# Patient Record
Sex: Male | Born: 1970 | Race: Black or African American | Hispanic: No | Marital: Single | Smoking: Never smoker
Health system: Southern US, Community
[De-identification: ages and names within clinical notes are randomized; demographics above are authoritative.]

---

## 1999-01-02 ENCOUNTER — Emergency Department (HOSPITAL_COMMUNITY): Admission: EM | Admit: 1999-01-02 | Discharge: 1999-01-02 | Payer: Self-pay | Admitting: Internal Medicine

## 2000-01-30 ENCOUNTER — Encounter: Admission: RE | Admit: 2000-01-30 | Discharge: 2000-01-30 | Payer: Self-pay | Admitting: Family Medicine

## 2001-12-02 ENCOUNTER — Encounter: Admission: RE | Admit: 2001-12-02 | Discharge: 2001-12-02 | Payer: Self-pay | Admitting: Family Medicine

## 2002-02-09 ENCOUNTER — Emergency Department (HOSPITAL_COMMUNITY): Admission: EM | Admit: 2002-02-09 | Discharge: 2002-02-09 | Payer: Self-pay | Admitting: Emergency Medicine

## 2003-02-14 ENCOUNTER — Emergency Department (HOSPITAL_COMMUNITY): Admission: EM | Admit: 2003-02-14 | Discharge: 2003-02-14 | Payer: Self-pay | Admitting: Emergency Medicine

## 2003-03-16 ENCOUNTER — Emergency Department (HOSPITAL_COMMUNITY): Admission: EM | Admit: 2003-03-16 | Discharge: 2003-03-16 | Payer: Self-pay | Admitting: Emergency Medicine

## 2004-02-20 ENCOUNTER — Emergency Department (HOSPITAL_COMMUNITY): Admission: EM | Admit: 2004-02-20 | Discharge: 2004-02-20 | Payer: Self-pay | Admitting: Family Medicine

## 2012-04-06 ENCOUNTER — Encounter (HOSPITAL_COMMUNITY): Payer: Self-pay | Admitting: Emergency Medicine

## 2012-04-06 ENCOUNTER — Emergency Department (HOSPITAL_COMMUNITY)
Admission: EM | Admit: 2012-04-06 | Discharge: 2012-04-06 | Disposition: A | Discharge status: Home / Self Care | Payer: 59 | Attending: Emergency Medicine | Admitting: Emergency Medicine

## 2012-04-06 DIAGNOSIS — Z23 Encounter for immunization: Secondary | ICD-10-CM | POA: Insufficient documentation

## 2012-04-06 DIAGNOSIS — S61209A Unspecified open wound of unspecified finger without damage to nail, initial encounter: Secondary | ICD-10-CM | POA: Insufficient documentation

## 2012-04-06 DIAGNOSIS — S61218A Laceration without foreign body of other finger without damage to nail, initial encounter: Secondary | ICD-10-CM

## 2012-04-06 DIAGNOSIS — W298XXA Contact with other powered powered hand tools and household machinery, initial encounter: Secondary | ICD-10-CM | POA: Insufficient documentation

## 2012-04-06 MED ORDER — TETANUS-DIPHTH-ACELL PERTUSSIS 5-2.5-18.5 LF-MCG/0.5 IM SUSP
0.5000 mL | Freq: Once | INTRAMUSCULAR | Status: AC
  Administered 2012-04-06: 0.5 mL via INTRAMUSCULAR
  Filled 2012-04-06: qty 0.5

## 2012-04-06 NOTE — ED Provider Notes (Signed)
History     CSN: [id number]  Arrival date & time 04/06/12  0904   First MD Initiated Contact with Patient 04/06/12 1008      Chief Complaint  Patient presents with  . Extremity Laceration    (Consider location/radiation/quality/duration/timing/severity/associated sxs/prior treatment) HPI The patient presents to the ER with a laceration to the L index finger. The patient was using a hedge trimmer yesterday around 2 pm when this occurred. The patient states that he cleansed the area well and wrapped it up. The patient denies weakness or numbness in the finger. The patient states that he did not take anything by mouth for any discomfort.  History reviewed. No pertinent past medical history.  History reviewed. No pertinent past surgical history.  No family history on file.  History  Substance Use Topics  . Smoking status: Never Smoker   . Smokeless tobacco: Never Used  . Alcohol Use: 14.4 oz/week    24 Cans of beer per week      Review of Systems All other systems negative except as documented in the HPI. All pertinent positives and negatives as reviewed in the HPI.  Allergies  Review of patient's allergies indicates no known allergies.  Home Medications  No current outpatient prescriptions on file.  BP 121/78  Pulse 64  Temp 98 F (36.7 C) (Oral)  Resp 16  SpO2 100%  Physical Exam  Constitutional: He appears well-developed and well-nourished. No distress.  Musculoskeletal:       Left hand: He exhibits normal range of motion. normal sensation noted. Normal strength noted. He exhibits no finger abduction.       Hands:   ED Course  Procedures (including critical care time)   LACERATION REPAIR Performed by: Carlyle Dolly Authorized by: Carlyle Dolly Consent: Verbal consent obtained. Risks and benefits: risks, benefits and alternatives were discussed Consent given by: patient Patient identity confirmed: provided demographic data Prepped and  Draped in normal sterile fashion Wound explored  Laceration Location: L lateral Distal 2nd  finger   Laceration Length: 2cm  No Foreign Bodies seen or palpated  Anesthesia: None  Local anesthetic: NA  Anesthetic total: 0  Irrigation method: syringe Amount of cleaning: standard  Skin closure: Steri strips  Number of sutures: see above  Technique: see above  Patient tolerance: Patient tolerated the procedure well with no immediate complications.  The laceration was delayed in the initial presentation and being that this is at the distal end of a digit I advised the patient of the increased risk of infection. We closed with steri strips and placed a bandage.   MDM         Carlyle Dolly, PA-C 04/06/12 1133

## 2012-04-06 NOTE — ED Notes (Signed)
Sustained laceration to left index finger yesterday afternoon while trimming the hedges. Would not stop bleeding so here to have it checked today.

## 2012-04-06 NOTE — ED Provider Notes (Signed)
Medical screening examination/treatment/procedure(s) were performed by non-physician practitioner and as supervising physician I was immediately available for consultation/collaboration.  Monice Lundy, MD 04/06/12 1522 

## 2014-02-18 ENCOUNTER — Encounter (HOSPITAL_COMMUNITY): Payer: Self-pay | Admitting: Emergency Medicine

## 2014-02-18 ENCOUNTER — Emergency Department (HOSPITAL_COMMUNITY): Payer: 59

## 2014-02-18 ENCOUNTER — Emergency Department (HOSPITAL_COMMUNITY)
Admission: EM | Admit: 2014-02-18 | Discharge: 2014-02-18 | Disposition: A | Discharge status: Home / Self Care | Payer: 59 | Attending: Emergency Medicine | Admitting: Emergency Medicine

## 2014-02-18 DIAGNOSIS — S39012A Strain of muscle, fascia and tendon of lower back, initial encounter: Secondary | ICD-10-CM

## 2014-02-18 DIAGNOSIS — IMO0002 Reserved for concepts with insufficient information to code with codable children: Secondary | ICD-10-CM | POA: Insufficient documentation

## 2014-02-18 DIAGNOSIS — I10 Essential (primary) hypertension: Secondary | ICD-10-CM | POA: Insufficient documentation

## 2014-02-18 DIAGNOSIS — S43402A Unspecified sprain of left shoulder joint, initial encounter: Secondary | ICD-10-CM

## 2014-02-18 DIAGNOSIS — Y92410 Unspecified street and highway as the place of occurrence of the external cause: Secondary | ICD-10-CM | POA: Insufficient documentation

## 2014-02-18 DIAGNOSIS — S335XXA Sprain of ligaments of lumbar spine, initial encounter: Secondary | ICD-10-CM | POA: Insufficient documentation

## 2014-02-18 DIAGNOSIS — Y9389 Activity, other specified: Secondary | ICD-10-CM | POA: Insufficient documentation

## 2014-02-18 MED ORDER — IBUPROFEN 800 MG PO TABS: 800.0000 mg | ORAL_TABLET | Freq: Three times a day (TID) | ORAL | Status: DC | PRN

## 2014-02-18 MED ORDER — HYDROCODONE-ACETAMINOPHEN 5-325 MG PO TABS: 1.0000 | ORAL_TABLET | Freq: Four times a day (QID) | ORAL | Status: DC | PRN

## 2014-02-18 NOTE — ED Notes (Signed)
Patient transported to X-ray 
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
## 2014-02-18 NOTE — Discharge Instructions (Signed)
Return here as needed.  Followup with primary care Dr. or an urgent care. your x-rays were negative.  Use ice and heat on your lower back and shoulder

## 2014-02-19 NOTE — ED Provider Notes (Signed)
Medical screening examination/treatment/procedure(s) were performed by non-physician practitioner and as supervising physician I was immediately available for consultation/collaboration.   EKG Interpretation None        William Narmeen Kerper, MD 02/19/14 0004 

## 2014-11-05 ENCOUNTER — Emergency Department (HOSPITAL_COMMUNITY): Payer: Self-pay

## 2014-11-05 ENCOUNTER — Encounter (HOSPITAL_COMMUNITY): Payer: Self-pay | Admitting: Emergency Medicine

## 2014-11-05 ENCOUNTER — Emergency Department (HOSPITAL_COMMUNITY)
Admission: EM | Admit: 2014-11-05 | Discharge: 2014-11-05 | Disposition: A | Discharge status: Home / Self Care | Payer: Self-pay | Attending: Emergency Medicine | Admitting: Emergency Medicine

## 2014-11-05 DIAGNOSIS — S92405B Nondisplaced unspecified fracture of left great toe, initial encounter for open fracture: Secondary | ICD-10-CM | POA: Insufficient documentation

## 2014-11-05 DIAGNOSIS — S99929A Unspecified injury of unspecified foot, initial encounter: Secondary | ICD-10-CM

## 2014-11-05 DIAGNOSIS — Y939 Activity, unspecified: Secondary | ICD-10-CM | POA: Insufficient documentation

## 2014-11-05 DIAGNOSIS — W500XXA Accidental hit or strike by another person, initial encounter: Secondary | ICD-10-CM | POA: Insufficient documentation

## 2014-11-05 DIAGNOSIS — Y929 Unspecified place or not applicable: Secondary | ICD-10-CM | POA: Insufficient documentation

## 2014-11-05 DIAGNOSIS — Y999 Unspecified external cause status: Secondary | ICD-10-CM | POA: Insufficient documentation

## 2014-11-05 DIAGNOSIS — Z23 Encounter for immunization: Secondary | ICD-10-CM | POA: Insufficient documentation

## 2014-11-05 MED ORDER — SULFAMETHOXAZOLE-TRIMETHOPRIM 800-160 MG PO TABS: 2.0000 | ORAL_TABLET | Freq: Two times a day (BID) | ORAL | Status: DC

## 2014-11-05 MED ORDER — HYDROCODONE-ACETAMINOPHEN 5-325 MG PO TABS: 1.0000 | ORAL_TABLET | Freq: Four times a day (QID) | ORAL | Status: DC | PRN

## 2014-11-05 MED ORDER — TETANUS-DIPHTH-ACELL PERTUSSIS 5-2.5-18.5 LF-MCG/0.5 IM SUSP
0.5000 mL | Freq: Once | INTRAMUSCULAR | Status: AC
  Administered 2014-11-05: 0.5 mL via INTRAMUSCULAR
  Filled 2014-11-05: qty 0.5

## 2014-11-05 MED ORDER — LIDOCAINE HCL (PF) 1 % IJ SOLN
10.0000 mL | Freq: Once | INTRAMUSCULAR | Status: AC
  Administered 2014-11-05: 10 mL
  Filled 2014-11-05: qty 10
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
## 2014-11-05 NOTE — Discharge Instructions (Signed)
Take pain medication as prescribed.  Do not drive or operate heavy machinery for 4-6 hours after taking pain medication. °

## 2016-09-14 IMAGING — DX DG TOE GREAT 2+V*L*
3 series · 3 of 3 positions shown · non-contrast
Comparison: None.

CLINICAL DATA: Blunt trauma to the left great toe with nail bed
avulsion

EXAM:
LEFT GREAT TOE

[toe ap]
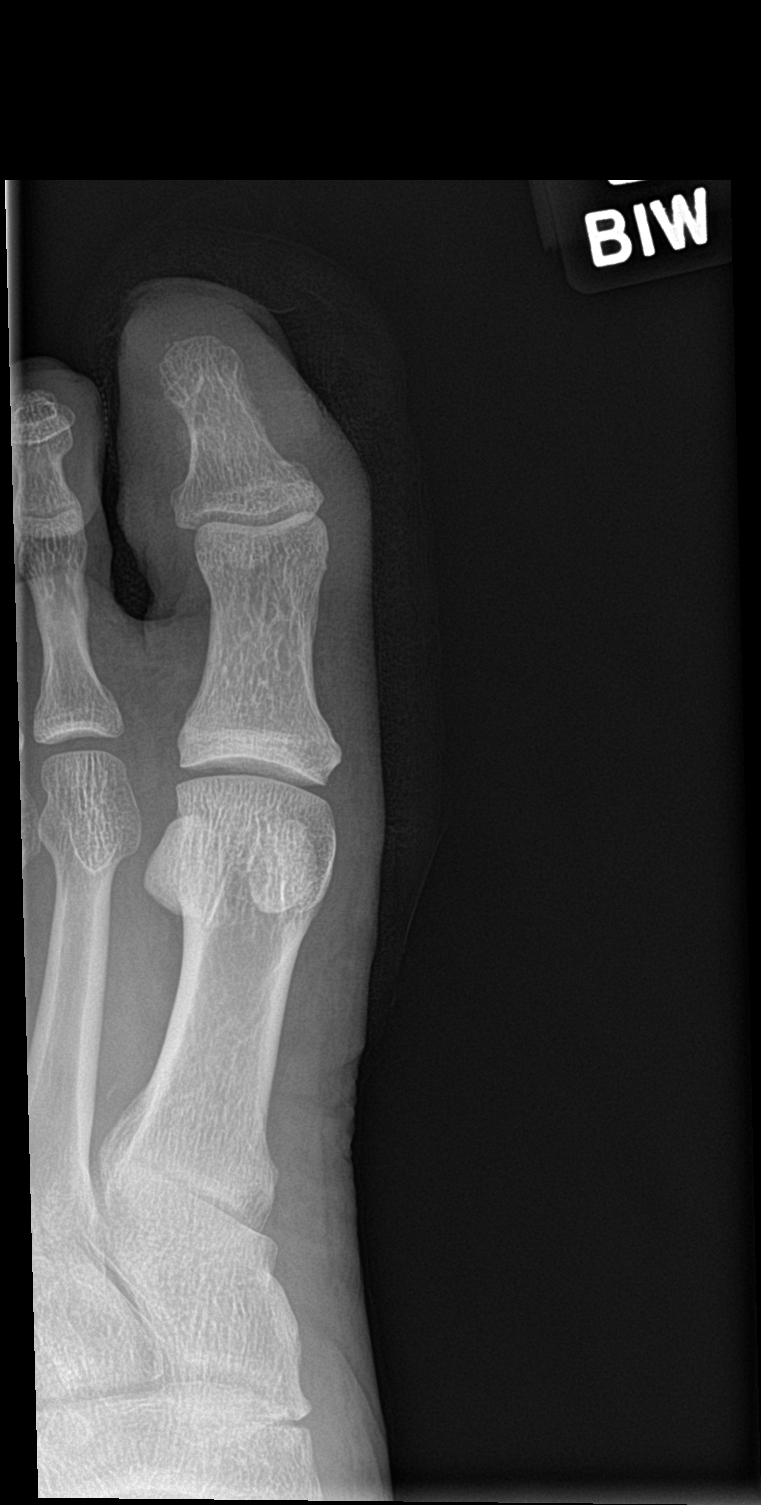

[toe obl]
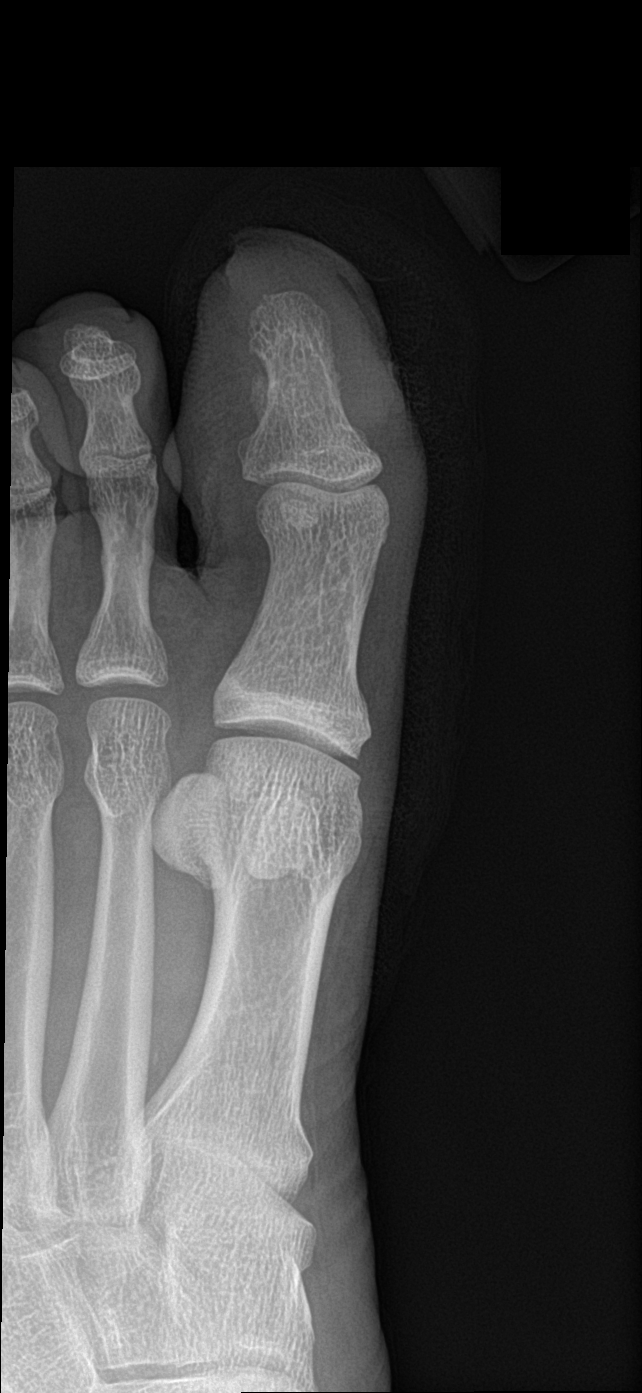

[toe lat]
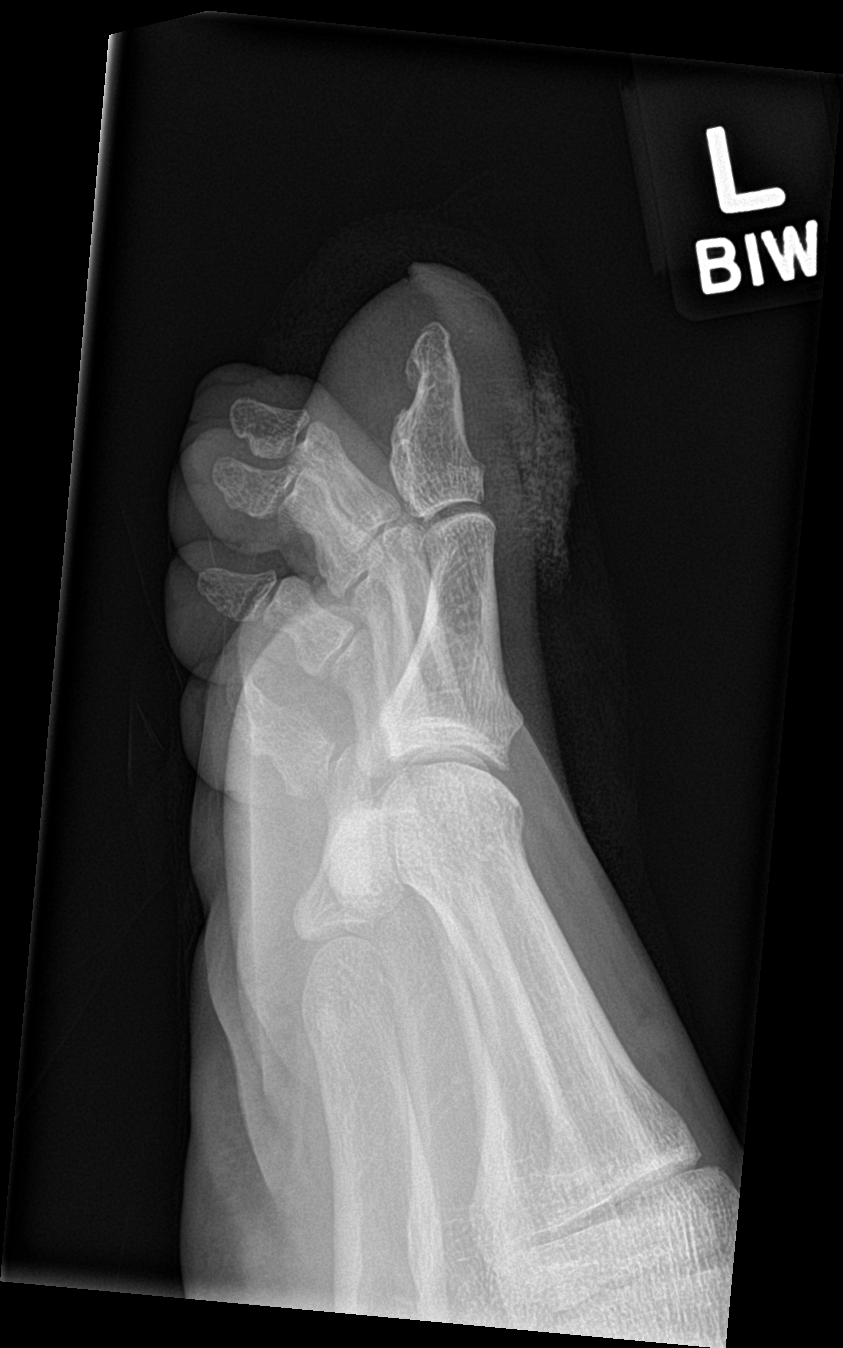

[3 of 3 positions shown; findings below may reference images not displayed]

FINDINGS: Soft tissue deformity over the distal great toe is compatible with
the provided clinical history of nail bed avulsion injury. Overlying
bandage material obscures detail. There is cortical discontinuity
suggesting nondisplaced tuft fracture of the distal phalanx of the
toe. No radiopaque foreign body.
IMPRESSION: Soft tissue deformity compatible with nail bed injury and possible
nondisplaced fracture of the tuft of the great toe, which may
increase the risk of osteomyelitis.

## 2018-05-30 ENCOUNTER — Emergency Department (HOSPITAL_COMMUNITY)
Admission: EM | Admit: 2018-05-30 | Discharge: 2018-05-30 | Disposition: A | Discharge status: Home / Self Care | Payer: BLUE CROSS/BLUE SHIELD | Attending: Emergency Medicine | Admitting: Emergency Medicine

## 2018-05-30 ENCOUNTER — Other Ambulatory Visit: Payer: Self-pay

## 2018-05-30 ENCOUNTER — Encounter (HOSPITAL_COMMUNITY): Payer: Self-pay | Admitting: *Deleted

## 2018-05-30 DIAGNOSIS — H6123 Impacted cerumen, bilateral: Secondary | ICD-10-CM | POA: Diagnosis not present

## 2018-05-30 DIAGNOSIS — R0981 Nasal congestion: Secondary | ICD-10-CM | POA: Insufficient documentation

## 2018-05-30 DIAGNOSIS — R519 Headache, unspecified: Secondary | ICD-10-CM

## 2018-05-30 DIAGNOSIS — R51 Headache: Secondary | ICD-10-CM | POA: Insufficient documentation

## 2018-05-30 MED ORDER — GUAIFENESIN 100 MG/5ML PO LIQD: 100.0000 mg | ORAL | 0 refills | Status: DC | PRN

## 2018-05-30 MED ORDER — PROMETHAZINE-DM 6.25-15 MG/5ML PO SYRP: 5.0000 mL | ORAL_SOLUTION | Freq: Four times a day (QID) | ORAL | 0 refills | Status: DC | PRN

## 2018-05-30 NOTE — ED Provider Notes (Signed)
[facility] Provider Note   CSN: [id number] Arrival date & time: 05/30/18  0542     History   Chief Complaint Chief Complaint  Patient presents with  . Headache    HPI Eric Orozco is a 47 y.o. male.  The history is provided by the patient. No language interpreter was used.  Headache       47 year old male without any significant past medical history presenting for evaluation of headache.  Patient report for the past 4 days he has gradual onset of head congestion and chest congestion.  He describes a congested feeling, occasional nonproductive cough, and feeling a bit foggy.  He mention his symptom is mild improving with Tylenol and some over-the-counter remedy however it has not fully resolved which frustrates him.  He denies any associated vision changes, runny nose, sneezing, ear pain or sore throat.  She he denies having fever, chills, neck stiffness, chest pain or shortness of breath.  He is not a smoker, denies any recent sick contact.  History reviewed. No pertinent past medical history.  There are no active problems to display for this patient.   History reviewed. No pertinent surgical history.      Home Medications    Prior to Admission medications   Medication Sig Start Date End Date Taking? Authorizing Provider  HYDROcodone-acetaminophen (NORCO/VICODIN) 5-325 MG per tablet Take 1-2 tablets by mouth every 6 (six) hours as needed. 11/05/14   Santiago Glad, PA-C  ibuprofen (ADVIL,MOTRIN) 800 MG tablet Take 1 tablet (800 mg total) by mouth every 8 (eight) hours as needed. 02/18/14   Lawyer, Cristal Deer, PA-C  sulfamethoxazole-trimethoprim (SEPTRA DS) 800-160 MG per tablet Take 2 tablets by mouth 2 (two) times daily. 11/05/14   Santiago Glad, PA-C    Family History Family History  Problem Relation Age of Onset  . Hypertension Other     Social History Social History   Tobacco Use  . Smoking status: Never Smoker   . Smokeless tobacco: Never Used  Substance Use Topics  . Alcohol use: Yes    Comment: occ  . Drug use: No     Allergies   Patient has no known allergies.   Review of Systems Review of Systems  Neurological: Positive for headaches.  All other systems reviewed and are negative.    Physical Exam Updated Vital Signs BP (!) 158/102 (BP Location: Right Arm)   Pulse (!) 105   Temp 97.7 F (36.5 C) (Oral)   Resp 16   Ht 5\' 8"  (1.727 m)   Wt 77.1 kg   SpO2 99%   BMI 25.85 kg/m   Physical Exam  Constitutional: He is oriented to person, place, and time. He appears well-developed and well-nourished. No distress.  Patient is well-appearing nontoxic, and resting comfortably in bed  HENT:  Head: Atraumatic.  Ears: TMs obstructed by cerumen impaction bilaterally Nose: Normal nares Throat: Uvula midline no tonsillar enlargement or exudates, no trismus Sinuses: Minimal tenderness to frontal and ethmoid sinuses on percussion.  Eyes: Pupils are equal, round, and reactive to light. Conjunctivae and EOM are normal.  Neck: Normal range of motion. Neck supple.  No nuchal rigidity, no cervical adenopathy.  Cardiovascular: Normal rate and regular rhythm.  Pulmonary/Chest: Effort normal and breath sounds normal. He has no wheezes. He has no rales.  Abdominal: Soft. He exhibits no distension. There is no tenderness.  Neurological: He is alert and oriented to person, place, and time. He has normal strength. No cranial  nerve deficit or sensory deficit. He displays a negative Romberg sign. GCS eye subscore is 4. GCS verbal subscore is 5. GCS motor subscore is 6.  Skin: No rash noted.  Psychiatric: He has a normal mood and affect.  Nursing note and vitals reviewed.    ED Treatments / Results  Labs (all labs ordered are listed, but only abnormal results are displayed) Labs Reviewed - No data to display  EKG None  Radiology No results found.  Procedures Procedures (including  critical care time)  Medications Ordered in ED Medications - No data to display   Initial Impression / Assessment and Plan / ED Course  I have reviewed the triage vital signs and the nursing notes.  Pertinent labs & imaging results that were available during my care of the patient were reviewed by me and considered in my medical decision making (see chart for details).     BP (!) 158/102 (BP Location: Right Arm)   Pulse (!) 105   Temp 97.7 F (36.5 C) (Oral)   Resp 16   Ht 5\' 8"  (1.727 m)   Wt 77.1 kg   SpO2 99%   BMI 25.85 kg/m    Final Clinical Impressions(s) / ED Diagnoses   Final diagnoses:  Bad headache    ED Discharge Orders         Ordered    guaiFENesin (ROBITUSSIN) 100 MG/5ML liquid  Every 4 hours PRN     05/30/18 0614    promethazine-dextromethorphan (PROMETHAZINE-DM) 6.25-15 MG/5ML syrup  4 times daily PRN     05/30/18 0614         6:12 AM Patient here with chest and head congestion.  He is well-appearing, no acute onset thunderclap headache concerning for subarachnoid hemorrhage, no fever or nuchal rigidity concerning for meningitis, no focal neuro deficit concerning for stroke or space-occupying lesion.  He would benefit from decongestant medication.  Return precautions discussed.   Fayrene Helper, PA-C 05/30/18 1610    Nira Conn, MD 05/31/18 0530

## 2018-05-30 NOTE — ED Triage Notes (Signed)
The pt has had a cold with head and chest congestion  He is tired of dealing with it

## 2022-05-16 ENCOUNTER — Emergency Department (HOSPITAL_BASED_OUTPATIENT_CLINIC_OR_DEPARTMENT_OTHER)
Admission: EM | Admit: 2022-05-16 | Discharge: 2022-05-16 | Disposition: A | Discharge status: Home / Self Care | Payer: BC Managed Care – PPO | Attending: Emergency Medicine | Admitting: Emergency Medicine

## 2022-05-16 ENCOUNTER — Other Ambulatory Visit: Payer: Self-pay

## 2022-05-16 ENCOUNTER — Encounter (HOSPITAL_BASED_OUTPATIENT_CLINIC_OR_DEPARTMENT_OTHER): Payer: Self-pay

## 2022-05-16 ENCOUNTER — Emergency Department (HOSPITAL_BASED_OUTPATIENT_CLINIC_OR_DEPARTMENT_OTHER): Payer: BC Managed Care – PPO | Admitting: Radiology

## 2022-05-16 DIAGNOSIS — R0981 Nasal congestion: Secondary | ICD-10-CM | POA: Insufficient documentation

## 2022-05-16 DIAGNOSIS — M791 Myalgia, unspecified site: Secondary | ICD-10-CM | POA: Insufficient documentation

## 2022-05-16 DIAGNOSIS — Z20822 Contact with and (suspected) exposure to covid-19: Secondary | ICD-10-CM | POA: Insufficient documentation

## 2022-05-16 DIAGNOSIS — R059 Cough, unspecified: Secondary | ICD-10-CM | POA: Insufficient documentation

## 2022-05-16 LAB — RESP PANEL BY RT-PCR (FLU A&B, COVID) ARPGX2
Influenza A by PCR: NEGATIVE
Influenza B by PCR: NEGATIVE
SARS Coronavirus 2 by RT PCR: NEGATIVE

## 2022-05-16 MED ORDER — HYDROCOD POLI-CHLORPHE POLI ER 10-8 MG/5ML PO SUER: 5.0000 mL | Freq: Two times a day (BID) | ORAL | 0 refills | Status: DC | PRN

## 2022-05-16 MED ORDER — ACETAMINOPHEN 325 MG PO TABS
650.0000 mg | ORAL_TABLET | Freq: Once | ORAL | Status: AC
  Administered 2022-05-16: 650 mg via ORAL
  Filled 2022-05-16: qty 2

## 2022-05-16 MED ORDER — HYDROCOD POLI-CHLORPHE POLI ER 10-8 MG/5ML PO SUER
5.0000 mL | Freq: Once | ORAL | Status: AC
  Administered 2022-05-16: 5 mL via ORAL
  Filled 2022-05-16: qty 5

## 2022-05-16 NOTE — Discharge Instructions (Addendum)
You are seen in the emergency department department today due to viral symptoms.  The chest x-ray is clear for pneumonia, your COVID and flu negative.  Continue taking Tylenol Motrin for pain.  I recommend a test next in the evenings, this is laced with the narcotics that he cannot take it and drive.  Follow-up with a primary in a week of resulting symptoms, return to the ED if things change or worsen significantly.

## 2022-05-16 NOTE — ED Triage Notes (Signed)
Pt states he has had nasal congestion x 3 days. Pt states he has a lot of sinus pressure. Productive cough, only at night.

## 2022-05-16 NOTE — ED Provider Notes (Signed)
[facility] Provider Note   CSN: [id number] Arrival date & time: 05/16/22  1556     History  Chief Complaint  Patient presents with   Nasal Congestion    Eric Orozco is a 51 y.o. male.  HPI   Patient with no documented comorbidities presents today due to nasal congestion, productive cough and myalgias times a week worse in the last 2 days.  Cough is mostly productive at night, he has been having fevers and generalized body aches.  He has tried Mucinex, Tylenol, Motrin, Flonase, over-the-counter allergy medicine with no improvement of his symptoms.  Denies abdominal pain, chest pain shortness of breath.    Home Medications Prior to Admission medications   Medication Sig Start Date End Date Taking? Authorizing Provider  guaiFENesin (ROBITUSSIN) 100 MG/5ML liquid Take 5-10 mLs (100-200 mg total) by mouth every 4 (four) hours as needed for cough or congestion. 05/30/18   Domenic Moras, PA-C  HYDROcodone-acetaminophen (NORCO/VICODIN) 5-325 MG per tablet Take 1-2 tablets by mouth every 6 (six) hours as needed. 11/05/14   Hyman Bible, PA-C  ibuprofen (ADVIL,MOTRIN) 800 MG tablet Take 1 tablet (800 mg total) by mouth every 8 (eight) hours as needed. 02/18/14   Lawyer, Harrell Gave, PA-C  promethazine-dextromethorphan (PROMETHAZINE-DM) 6.25-15 MG/5ML syrup Take 5 mLs by mouth 4 (four) times daily as needed for cough (congestion). 05/30/18   Domenic Moras, PA-C  sulfamethoxazole-trimethoprim (SEPTRA DS) 800-160 MG per tablet Take 2 tablets by mouth 2 (two) times daily. 11/05/14   Hyman Bible, PA-C      Allergies    Patient has no known allergies.    Review of Systems   Review of Systems  Physical Exam Updated Vital Signs BP (!) 157/106 (BP Location: Right Arm)   Pulse 84   Temp 98.9 F (37.2 C) (Oral)   Resp 18   Ht 5\' 8"  (1.727 m)   Wt 75.3 kg   SpO2 98%   BMI 25.24 kg/m  Physical Exam Vitals and nursing note reviewed. Exam conducted with a  chaperone present.  Constitutional:      Appearance: Normal appearance.  HENT:     Head: Normocephalic and atraumatic.     Nose: Congestion present.     Mouth/Throat:     Pharynx: No oropharyngeal exudate.  Eyes:     General: No scleral icterus.       Right eye: No discharge.        Left eye: No discharge.     Extraocular Movements: Extraocular movements intact.     Pupils: Pupils are equal, round, and reactive to light.  Cardiovascular:     Rate and Rhythm: Normal rate and regular rhythm.     Pulses: Normal pulses.     Heart sounds: Normal heart sounds. No murmur heard.    No friction rub. No gallop.  Pulmonary:     Effort: Pulmonary effort is normal. No respiratory distress.     Breath sounds: Normal breath sounds.     Comments: Lungs are clear to auscultation bilaterally Abdominal:     General: Abdomen is flat. Bowel sounds are normal. There is no distension.     Palpations: Abdomen is soft.     Tenderness: There is no abdominal tenderness.  Skin:    General: Skin is warm and dry.     Coloration: Skin is not jaundiced.  Neurological:     Mental Status: He is alert. Mental status is at baseline.     Coordination: Coordination normal.  ED Results / Procedures / Treatments   Labs (all labs ordered are listed, but only abnormal results are displayed) Labs Reviewed  RESP PANEL BY RT-PCR (FLU A&B, COVID) [id number]    EKG None  Radiology DG Chest 2 View  Result Date: 05/16/2022 CLINICAL DATA:  Productive cough. EXAM: CHEST - 2 VIEW COMPARISON:  None Available. FINDINGS: The heart size and mediastinal contours are within normal limits. Both lungs are clear. The visualized skeletal structures are unremarkable. IMPRESSION: No active cardiopulmonary disease. Electronically Signed   By: Lupita Raider M.D.   On: 05/16/2022 18:12    Procedures Procedures    Medications Ordered in ED Medications  chlorpheniramine-HYDROcodone (TUSSIONEX) 10-8 MG/5ML suspension 5 mL (has  no administration in time range)  acetaminophen (TYLENOL) tablet 650 mg (has no administration in time range)    ED Course/ Medical Decision Making/ A&P                           Medical Decision Making Amount and/or Complexity of Data Reviewed Radiology: ordered.  Risk OTC drugs. Prescription drug management.   Patient presents due to viral symptoms times week worse in the last 3 days.  Ddx includes not limited to URI, allergies, pneumonia.  On exam patient is nontoxic-appearing.  His lungs are clear to auscultation, not hypoxic.  There is nasal congestion but his erythema is clear, abdomen is soft and nontender and he has a regular rhythm. -BP (!) 157/106 (BP Location: Right Arm)   Pulse 84   Temp 98.9 F (37.2 C) (Oral)   Resp 18   Ht 5\' 8"  (1.727 m)   Wt 75.3 kg   SpO2 98%   BMI 25.24 kg/m   I ordered and viewed COVID and flu test which was negative.  Also ordered and viewed chest x-ray and per my interpretation no acute process, agree with radiologist.  No signs of pneumonia.    I ordered test next with the patient given that symptoms are worse especially in the evening, also ordered Tylenol for the headache.    I do not think patient needs any additional work-up or imaging at this time given these appear to be very viral symptoms and he is very nontoxic-appearing with stable vitals.  We discussed close follow-up with peds PCP, will send home with stronger cough medicine as requested.        Final Clinical Impression(s) / ED Diagnoses Final diagnoses:  None    Rx / DC Orders ED Discharge Orders     None         , Theron Arista 05/16/22 1827    07/16/22, MD 05/17/22 0100

## 2022-09-22 ENCOUNTER — Other Ambulatory Visit: Payer: Self-pay

## 2022-09-22 ENCOUNTER — Ambulatory Visit (HOSPITAL_COMMUNITY)
Admission: EM | Admit: 2022-09-22 | Discharge: 2022-09-22 | Disposition: A | Discharge status: Home / Self Care | Payer: BC Managed Care – PPO | Attending: Family Medicine | Admitting: Family Medicine

## 2022-09-22 ENCOUNTER — Encounter (HOSPITAL_COMMUNITY): Payer: Self-pay | Admitting: *Deleted

## 2022-09-22 DIAGNOSIS — Z202 Contact with and (suspected) exposure to infections with a predominantly sexual mode of transmission: Secondary | ICD-10-CM

## 2022-09-22 NOTE — ED Notes (Signed)
Cytology sample is labeled, lid is secure, liquid present  and foil lid intact

## 2022-09-22 NOTE — ED Provider Notes (Signed)
  [facility]   [id number] 09/22/22 Arrival Time: 1006  ASSESSMENT & PLAN:  1. Possible exposure to STD    No empiric tx.    Discharge Instructions      We have sent testing for sexually transmitted infections. We will notify you of any positive results once they are received. If required, we will prescribe any medications you might need.     Pending: Labs Reviewed  CYTOLOGY, (ORAL, ANAL, URETHRAL) ANCILLARY ONLY    Will notify of any positive results. Instructed to refrain from sexual activity for at least seven days.  Reviewed expectations re: course of current medical issues. Questions answered. Outlined signs and symptoms indicating need for more acute intervention. Patient verbalized understanding. After Visit Summary given.   SUBJECTIVE:  Eric Orozco is a 52 y.o. male who requests STI screening. "Told I may have been exposed to something". No symptoms incl penile discharge. Afebrile. No abdominal or pelvic pain. No n/v. No rashes or lesions. Reports that he is sexually active with single male partner.  OBJECTIVE:  Vitals:   09/22/22 1047  BP: (!) 141/84  Pulse: 74  Resp: 18  Temp: 97.7 F (36.5 C)  SpO2: 95%     General appearance: alert, cooperative, appears stated age and no distress Throat: lips, mucosa, and tongue normal; teeth and gums normal Lungs: unlabored respirations; speaks full sentences without difficulty GU: deferred Skin: warm and dry Psychological: alert and cooperative; normal mood and affect.    Labs Reviewed  CYTOLOGY, (ORAL, ANAL, URETHRAL) ANCILLARY ONLY    No Known Allergies  History reviewed. No pertinent past medical history. Family History  Problem Relation Age of Onset   Hypertension Other    Social History   Socioeconomic History   Marital status: Single    Spouse name: Not on file   Number of children: Not on file   Years of education: Not on file   Highest education level: Not on file   Occupational History   Not on file  Tobacco Use   Smoking status: Never   Smokeless tobacco: Never  Substance and Sexual Activity   Alcohol use: Yes    Comment: occ   Drug use: No   Sexual activity: Not on file  Other Topics Concern   Not on file  Social History Narrative   Not on file   Social Determinants of Health   Financial Resource Strain: Not on file  Food Insecurity: Not on file  Transportation Needs: Not on file  Physical Activity: Not on file  Stress: Not on file  Social Connections: Not on file  Intimate Partner Violence: Not on file           Vanessa Kick, MD 09/22/22 1117

## 2022-09-22 NOTE — ED Triage Notes (Signed)
Pt was notified by partner for possible STD.

## 2022-09-22 NOTE — Discharge Instructions (Signed)
We have sent testing for sexually transmitted infections. We will notify you of any positive results once they are received. If required, we will prescribe any medications you might need. °

## 2022-09-24 LAB — CYTOLOGY, (ORAL, ANAL, URETHRAL) ANCILLARY ONLY
Chlamydia: NEGATIVE
Comment: NEGATIVE
Comment: NEGATIVE
Comment: NORMAL
Neisseria Gonorrhea: NEGATIVE
Trichomonas: POSITIVE — AB

## 2022-09-25 ENCOUNTER — Telehealth (HOSPITAL_COMMUNITY): Payer: Self-pay | Admitting: Emergency Medicine

## 2022-09-25 MED ORDER — METRONIDAZOLE 500 MG PO TABS: 2000.0000 mg | ORAL_TABLET | Freq: Once | ORAL | 0 refills | Status: AC
